# Patient Record
Sex: Male | Born: 1960 | Race: White | Hispanic: No | Marital: Single | Smoking: Current every day smoker
Health system: Southern US, Community
[De-identification: ages and names within clinical notes are randomized; demographics above are authoritative.]

## PROBLEM LIST (undated history)

## (undated) ENCOUNTER — Emergency Department (HOSPITAL_COMMUNITY): Payer: Self-pay

## (undated) HISTORY — PX: WISDOM TOOTH EXTRACTION: SHX21

---

## 2000-09-25 ENCOUNTER — Emergency Department (HOSPITAL_COMMUNITY): Admission: EM | Admit: 2000-09-25 | Discharge: 2000-09-25 | Payer: Self-pay

## 2000-09-25 ENCOUNTER — Encounter: Payer: Self-pay | Admitting: Emergency Medicine

## 2011-04-30 ENCOUNTER — Encounter (HOSPITAL_COMMUNITY): Payer: Self-pay | Admitting: Emergency Medicine

## 2011-04-30 ENCOUNTER — Emergency Department (HOSPITAL_COMMUNITY)
Admission: EM | Admit: 2011-04-30 | Discharge: 2011-05-01 | Disposition: A | Discharge status: Home / Self Care | Payer: No Typology Code available for payment source | Attending: Emergency Medicine | Admitting: Emergency Medicine

## 2011-04-30 DIAGNOSIS — IMO0001 Reserved for inherently not codable concepts without codable children: Secondary | ICD-10-CM | POA: Insufficient documentation

## 2011-04-30 DIAGNOSIS — M545 Low back pain, unspecified: Secondary | ICD-10-CM | POA: Insufficient documentation

## 2011-04-30 DIAGNOSIS — M542 Cervicalgia: Secondary | ICD-10-CM | POA: Insufficient documentation

## 2011-04-30 DIAGNOSIS — M549 Dorsalgia, unspecified: Secondary | ICD-10-CM | POA: Insufficient documentation

## 2011-04-30 DIAGNOSIS — M25559 Pain in unspecified hip: Secondary | ICD-10-CM | POA: Insufficient documentation
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
## 2011-05-01 ENCOUNTER — Emergency Department (HOSPITAL_COMMUNITY): Payer: No Typology Code available for payment source

## 2011-05-01 MED ORDER — HYDROCODONE-ACETAMINOPHEN 5-325 MG PO TABS: 1.0000 | ORAL_TABLET | Freq: Four times a day (QID) | ORAL | Status: AC | PRN

## 2011-05-01 MED ORDER — HYDROCODONE-ACETAMINOPHEN 5-325 MG PO TABS
1.0000 | ORAL_TABLET | Freq: Once | ORAL | Status: AC
  Administered 2011-05-01: 1 via ORAL
  Filled 2011-05-01: qty 1

## 2011-05-01 MED ORDER — IBUPROFEN 200 MG PO TABS
400.0000 mg | ORAL_TABLET | Freq: Once | ORAL | Status: AC
  Administered 2011-05-01: 400 mg via ORAL
  Filled 2011-05-01: qty 2

## 2011-05-01 MED ORDER — IBUPROFEN 600 MG PO TABS: 600.0000 mg | ORAL_TABLET | Freq: Four times a day (QID) | ORAL | Status: AC | PRN

## 2011-05-01 NOTE — ED Provider Notes (Signed)
Medical screening examination/treatment/procedure(s) were performed by non-physician practitioner and as supervising physician I was immediately available for consultation/collaboration.   Reyce Lubeck W Knox Cervi, MD 05/01/11 1605 

## 2012-03-05 ENCOUNTER — Emergency Department (HOSPITAL_COMMUNITY): Payer: Self-pay

## 2012-03-05 ENCOUNTER — Emergency Department (HOSPITAL_COMMUNITY)
Admission: EM | Admit: 2012-03-05 | Discharge: 2012-03-05 | Disposition: A | Discharge status: Home / Self Care | Payer: Self-pay | Attending: Emergency Medicine | Admitting: Emergency Medicine

## 2012-03-05 ENCOUNTER — Encounter (HOSPITAL_COMMUNITY): Payer: Self-pay | Admitting: Emergency Medicine

## 2012-03-05 DIAGNOSIS — R112 Nausea with vomiting, unspecified: Secondary | ICD-10-CM | POA: Insufficient documentation

## 2012-03-05 DIAGNOSIS — R059 Cough, unspecified: Secondary | ICD-10-CM | POA: Insufficient documentation

## 2012-03-05 DIAGNOSIS — R509 Fever, unspecified: Secondary | ICD-10-CM | POA: Insufficient documentation

## 2012-03-05 DIAGNOSIS — R5383 Other fatigue: Secondary | ICD-10-CM | POA: Insufficient documentation

## 2012-03-05 DIAGNOSIS — F17200 Nicotine dependence, unspecified, uncomplicated: Secondary | ICD-10-CM | POA: Insufficient documentation

## 2012-03-05 DIAGNOSIS — R5381 Other malaise: Secondary | ICD-10-CM | POA: Insufficient documentation

## 2012-03-05 DIAGNOSIS — R05 Cough: Secondary | ICD-10-CM | POA: Insufficient documentation

## 2012-03-05 DIAGNOSIS — J3489 Other specified disorders of nose and nasal sinuses: Secondary | ICD-10-CM | POA: Insufficient documentation

## 2012-03-05 DIAGNOSIS — IMO0001 Reserved for inherently not codable concepts without codable children: Secondary | ICD-10-CM | POA: Insufficient documentation

## 2012-03-05 DIAGNOSIS — R51 Headache: Secondary | ICD-10-CM | POA: Insufficient documentation

## 2012-03-05 DIAGNOSIS — R197 Diarrhea, unspecified: Secondary | ICD-10-CM | POA: Insufficient documentation

## 2012-03-05 DIAGNOSIS — R52 Pain, unspecified: Secondary | ICD-10-CM | POA: Insufficient documentation

## 2012-03-05 LAB — CBC WITH DIFFERENTIAL/PLATELET
Basophils Absolute: 0 10*3/uL (ref 0.0–0.1)
Eosinophils Absolute: 0 10*3/uL (ref 0.0–0.7)
Lymphs Abs: 1.5 10*3/uL (ref 0.7–4.0)
MCH: 31.7 pg (ref 26.0–34.0)
MCHC: 36.3 g/dL — ABNORMAL HIGH (ref 30.0–36.0)
MCV: 87.5 fL (ref 78.0–100.0)
Monocytes Absolute: 1.7 10*3/uL — ABNORMAL HIGH (ref 0.1–1.0)
Neutrophils Relative %: 81 % — ABNORMAL HIGH (ref 43–77)
Platelets: 336 10*3/uL (ref 150–400)
RBC: 4.95 MIL/uL (ref 4.22–5.81)
RDW: 12.9 % (ref 11.5–15.5)

## 2012-03-05 LAB — URINALYSIS, MICROSCOPIC ONLY
Glucose, UA: NEGATIVE mg/dL
Hgb urine dipstick: NEGATIVE
Leukocytes, UA: NEGATIVE
Protein, ur: 30 mg/dL — AB
Specific Gravity, Urine: 1.013 (ref 1.005–1.030)
Urobilinogen, UA: 0.2 mg/dL (ref 0.0–1.0)

## 2012-03-05 LAB — COMPREHENSIVE METABOLIC PANEL
ALT: 18 U/L (ref 0–53)
AST: 20 U/L (ref 0–37)
Calcium: 9.7 mg/dL (ref 8.4–10.5)
Creatinine, Ser: 0.75 mg/dL (ref 0.50–1.35)
GFR calc Af Amer: >90 mL/min (ref >90)
GFR calc non Af Amer: >90 mL/min (ref >90)
Sodium: 134 mEq/L — ABNORMAL LOW (ref 135–145)
Total Protein: 7.8 g/dL (ref 6.0–8.3)

## 2012-03-05 MED ORDER — SODIUM CHLORIDE 0.9 % IV BOLUS (SEPSIS): 1000.0000 mL | Freq: Once | INTRAVENOUS | Status: DC

## 2012-03-05 MED ORDER — ALBUTEROL SULFATE HFA 108 (90 BASE) MCG/ACT IN AERS
2.0000 | INHALATION_SPRAY | RESPIRATORY_TRACT | Status: DC | PRN
  Administered 2012-03-05: 2 via RESPIRATORY_TRACT
  Filled 2012-03-05: qty 6.7

## 2012-03-05 MED ORDER — ONDANSETRON HCL 4 MG/2ML IJ SOLN
4.0000 mg | Freq: Once | INTRAMUSCULAR | Status: AC
  Administered 2012-03-05: 4 mg via INTRAVENOUS
  Filled 2012-03-05: qty 2

## 2012-03-05 MED ORDER — ONDANSETRON 8 MG PO TBDP: 8.0000 mg | ORAL_TABLET | Freq: Three times a day (TID) | ORAL | Status: AC | PRN

## 2012-03-05 MED ORDER — ONDANSETRON 8 MG PO TBDP: 8.0000 mg | ORAL_TABLET | Freq: Three times a day (TID) | ORAL | Status: DC | PRN

## 2012-03-05 MED ORDER — ACETAMINOPHEN 325 MG PO TABS
650.0000 mg | ORAL_TABLET | Freq: Once | ORAL | Status: AC
  Administered 2012-03-05: 650 mg via ORAL
  Filled 2012-03-05: qty 2

## 2012-03-05 MED ORDER — AEROCHAMBER Z-STAT PLUS/MEDIUM MISC
1.0000 | Freq: Once | Status: AC
  Administered 2012-03-05: 1
  Filled 2012-03-05: qty 1

## 2012-03-05 MED ORDER — IBUPROFEN 600 MG PO TABS: 600.0000 mg | ORAL_TABLET | Freq: Four times a day (QID) | ORAL | Status: AC | PRN

## 2012-03-05 MED ORDER — IBUPROFEN 600 MG PO TABS: 600.0000 mg | ORAL_TABLET | Freq: Four times a day (QID) | ORAL | Status: DC | PRN

## 2012-03-05 MED ORDER — SODIUM CHLORIDE 0.9 % IV SOLN
1000.0000 mL | Freq: Once | INTRAVENOUS | Status: AC
  Administered 2012-03-05: 1000 mL via INTRAVENOUS

## 2012-03-05 MED ORDER — SODIUM CHLORIDE 0.9 % IV BOLUS (SEPSIS)
1000.0000 mL | Freq: Once | INTRAVENOUS | Status: AC
  Administered 2012-03-05: 1000 mL via INTRAVENOUS

## 2012-03-05 NOTE — ED Notes (Signed)
Pt presenting to ed with c/o nausea, vomiting, diarrhea and body aches x 5 day with chills

## 2012-03-05 NOTE — Progress Notes (Signed)
Pt listed with no insurance coverage CM and [facility] liaison spoke with pt.  Pt offered services to assist with finding a [place] self pay provider & health reform information 

## 2012-03-05 NOTE — ED Provider Notes (Signed)
History     CSN: [id number]  Arrival date & time 03/05/12  1131   First MD Initiated Contact with Patient 03/05/12 1226      Chief Complaint  Patient presents with  . Nausea  . Emesis  . Headache  . Generalized Body Aches    (Consider location/radiation/quality/duration/timing/severity/associated sxs/prior treatment) HPI Comments: Patient presents with four-day history of subjective fever, chills, myalgias, frequent nonbloody, nonbilious vomiting, nonbloody diarrhea, cough. States the symptoms are gradually getting worse. He has been able to keep down small amounts of water. No treatments prior to arrival. No chest pain or abdominal pain. No history of COPD although the patient is a long-time smoker. No sick contacts. Did not get a flu shot this year. Onset acute. Nothing makes symptoms better or worse.  Patient is a 52 y.o. male presenting with vomiting and headaches. The history is provided by the patient.  Emesis  Associated symptoms include chills, cough, diarrhea, a fever, headaches and myalgias. Pertinent negatives include no abdominal pain.  Headache  Associated symptoms include a fever, nausea and vomiting. Pertinent negatives include no shortness of breath.    History reviewed. No pertinent past medical history.  History reviewed. No pertinent past surgical history.  No family history on file.  History  Substance Use Topics  . Smoking status: Current Every Day Smoker -- 1.0 packs/day    Types: Cigarettes  . Smokeless tobacco: Not on file  . Alcohol Use: No      Review of Systems  Constitutional: Positive for fever, chills and fatigue.  HENT: Positive for congestion and sore throat. Negative for ear pain, rhinorrhea, neck stiffness and sinus pressure.   Eyes: Negative for redness.  Respiratory: Positive for cough. Negative for shortness of breath and wheezing.   Gastrointestinal: Positive for nausea, vomiting and diarrhea. Negative for abdominal pain.    Genitourinary: Negative for dysuria.  Musculoskeletal: Positive for myalgias.  Skin: Negative for rash.  Neurological: Positive for headaches.  Hematological: Negative for adenopathy.    Allergies  Review of patient's allergies indicates no known allergies.  Home Medications   Current Outpatient Rx  Name  Route  Sig  Dispense  Refill  . DAYQUIL MULTI-SYMPTOM COLD/FLU PO   Oral   Take 2 tablets by mouth every 6 (six) hours as needed. For flu symptoms         . IBUPROFEN 600 MG PO TABS   Oral   Take 1 tablet (600 mg total) by mouth every 6 (six) hours as needed for pain.   20 tablet   0   . ONDANSETRON 8 MG PO TBDP   Oral   Take 1 tablet (8 mg total) by mouth every 8 (eight) hours as needed for nausea.   6 tablet   0     BP 153/99  Pulse 100  Temp 99.2 F (37.3 C) (Oral)  Resp 24  SpO2 100%  Physical Exam  Nursing note and vitals reviewed. Constitutional: He appears well-developed and well-nourished.  HENT:  Head: Normocephalic and atraumatic. No trismus in the jaw.  Right Ear: Tympanic membrane, external ear and ear canal normal.  Left Ear: Tympanic membrane, external ear and ear canal normal.  Nose: Nose normal. No mucosal edema or rhinorrhea.  Mouth/Throat: Uvula is midline. Mucous membranes are dry. No uvula swelling. Posterior oropharyngeal erythema present. No oropharyngeal exudate, posterior oropharyngeal edema or tonsillar abscesses.  Eyes: Conjunctivae normal are normal. Right eye exhibits no discharge. Left eye exhibits no discharge.  Neck: Normal  range of motion. Neck supple.  Cardiovascular: Normal rate, regular rhythm and normal heart sounds.   No murmur heard. Pulmonary/Chest: Effort normal and breath sounds normal. No respiratory distress. He has no wheezes. He has no rales.  Abdominal: Soft. There is no tenderness.  Neurological: He is alert.  Skin: Skin is warm and dry.  Psychiatric: He has a normal mood and affect.    ED Course   Procedures (including critical care time)  Labs Reviewed  CBC WITH DIFFERENTIAL - Abnormal; Notable for the following:    WBC 17.2 (*)     MCHC 36.3 (*)     Neutrophils Relative 81 (*)     Lymphocytes Relative 9 (*)     Neutro Abs 14.0 (*)     Monocytes Absolute 1.7 (*)     All other components within normal limits  COMPREHENSIVE METABOLIC PANEL - Abnormal; Notable for the following:    Sodium 134 (*)     Potassium 3.2 (*)     Chloride 92 (*)     Glucose, Bld 175 (*)     All other components within normal limits  URINALYSIS, MICROSCOPIC ONLY - Abnormal; Notable for the following:    pH 8.5 (*)     Ketones, ur TRACE (*)     Protein, ur 30 (*)     All other components within normal limits  LIPASE, BLOOD  LAB REPORT - SCANNED   Dg Chest 2 View  03/05/2012  *RADIOLOGY REPORT*  Clinical Data: Nausea, emesis, headache, body aches  CHEST - 2 VIEW  Comparison: Prior radiographs of the thoracic spine including the chest 05/01/2011  Findings: Mild pulmonary hyperinflation.  Otherwise, negative for focal airspace consolidation, or pulmonary edema.  There may be minimal peribronchial cuffing.  Cardiac and mediastinal contours within normal limits.  No acute osseous abnormality.  IMPRESSION:  Mild pulmonary hyperinflation and bronchitic changes without focal infiltrate.   Original Report Authenticated By: Malachy Moan, M.D.      1. Influenza-like illness     12:42 PM Patient seen and examined. Work-up initiated. Medications ordered.   Vital signs reviewed and are as follows: Filed Vitals:   03/05/12 1150  BP: 153/99  Pulse: 100  Temp: 99.2 F (37.3 C)  Resp: 24   Patient in ED for several hours. Was not able to urinate at first, given several liters of fluid. No further vomiting with antiemetics and has tolerated PO's.   D/c home with albuterol inhaler. Patient counseled on use of albuterol HFA.  Told to use 1-2 puffs q 4 hours as needed for SOB.  Patient discharged to home.  Encouraged to rest and drink plenty of fluids.  Patient told to return to ED or see their primary doctor if their symptoms worsen, high fever not controlled with tylenol, persistent vomiting, they feel they are dehydrated, or if they have any other concerns.  Patient verbalized understanding and agreed with plan.    BP 124/73  Pulse 88  Temp 99.3 F (37.4 C) (Oral)  Resp 18  SpO2 94%   MDM  ILI: symptoms controlled in ED. Coughing, no PNA on CXR. Sx are consistent with ILI and his leukocytosis is likely a response to this. Patient appears like he is not feeling well, but is not toxic in appearance. No resp distress. Probable mild/moderate dehydration -- fluids given, tolerating PO's at d/c. No medical emergency suspected. He will need to rest for several days.         Renne Crigler,  PA 03/06/12 0820

## 2012-03-05 NOTE — ED Notes (Signed)
PA at bedside.

## 2012-03-05 NOTE — ED Notes (Signed)
OZH:[id number]<HQ> Expected date:03/05/12<BR> Expected time:11:23 AM<BR> Means of arrival:Ambulance<BR> Comments:<BR> 55yoM/weakness/hypertensive

## 2012-03-05 NOTE — ED Notes (Signed)
Made patient aware that we need a urine sample and he stated that he is still unable to use it.

## 2012-03-05 NOTE — ED Notes (Signed)
Instructions given on use of inhaler and aerochamber; patient able to perform return demonstration. Patient given phone to call for ride.

## 2012-03-06 NOTE — ED Provider Notes (Signed)
Medical screening examination/treatment/procedure(s) were performed by non-physician practitioner and as supervising physician I was immediately available for consultation/collaboration.  Tobin Chad, MD 03/06/12 [phone]

## 2013-05-28 ENCOUNTER — Emergency Department (HOSPITAL_COMMUNITY)
Admission: EM | Admit: 2013-05-28 | Discharge: 2013-05-28 | Disposition: A | Discharge status: Home / Self Care | Payer: Self-pay | Attending: Emergency Medicine | Admitting: Emergency Medicine

## 2013-05-28 ENCOUNTER — Encounter (HOSPITAL_COMMUNITY): Payer: Self-pay | Admitting: Emergency Medicine

## 2013-05-28 ENCOUNTER — Emergency Department (HOSPITAL_COMMUNITY): Payer: Self-pay

## 2013-05-28 DIAGNOSIS — S9031XA Contusion of right foot, initial encounter: Secondary | ICD-10-CM

## 2013-05-28 DIAGNOSIS — Y9302 Activity, running: Secondary | ICD-10-CM | POA: Insufficient documentation

## 2013-05-28 DIAGNOSIS — S9030XA Contusion of unspecified foot, initial encounter: Secondary | ICD-10-CM | POA: Insufficient documentation

## 2013-05-28 DIAGNOSIS — IMO0002 Reserved for concepts with insufficient information to code with codable children: Secondary | ICD-10-CM | POA: Insufficient documentation

## 2013-05-28 DIAGNOSIS — Y929 Unspecified place or not applicable: Secondary | ICD-10-CM | POA: Insufficient documentation

## 2013-05-28 DIAGNOSIS — F17200 Nicotine dependence, unspecified, uncomplicated: Secondary | ICD-10-CM | POA: Insufficient documentation

## 2013-05-28 MED ORDER — TRAMADOL HCL 50 MG PO TABS: 50.0000 mg | ORAL_TABLET | Freq: Four times a day (QID) | ORAL | Status: AC | PRN

## 2013-05-28 NOTE — Progress Notes (Signed)
[facility] provided pt with a list of primary care resources and a [facility] [employer] Card application to help patient establish primary care.  °

## 2013-05-28 NOTE — ED Notes (Signed)
Pt from home reports that he was chasing a soccer ball, lost his shoe and stepped on a rock approx 1 week ago. Pt reports that he thought it was just bruised, but is still unable to walk or bear full weight. Pt can wiggle toes and has good cap refill. Pt denies other injuries. Pt is A&O and in NAD

## 2013-05-28 NOTE — ED Provider Notes (Signed)
CSN: [id number]     Arrival date & time 05/28/13  1214 History  This chart was scribed for non-physician practitioner Junius FinnerErin O'Malley, PA-C working with Raeford RazorStephen Kohut, MD by Dorothey Basemania Sutton, ED Scribe. This patient was seen in room WTR9/WTR9 and the patient's care was started at 1:03 PM.    Chief Complaint  Patient presents with  . Foot Injury   The history is provided by the patient. No language interpreter was used.   HPI Comments: James Wilson is a 53 y.o. male who presents to the Emergency Department complaining of an injury to the right foot that he sustained about a week ago when he reports that he stepped on a rock while running. Patient is complaining of a constant, gradual onset pain, 7/10 currently, to the bottom of the foot that he states is exacerbated to a 10/10 with walking/bearing weight. Patient reports intermittently taking aspirin at home without significant relief. He denies history of prior injury to the area. He denies any allergies to medications. Patient has no other pertinent medical history.   History reviewed. No pertinent past medical history. Past Surgical History  Procedure Laterality Date  . Wisdom tooth extraction     No family history on file. History  Substance Use Topics  . Smoking status: Current Every Day Smoker -- 1.00 packs/day    Types: Cigarettes  . Smokeless tobacco: Not on file  . Alcohol Use: Yes     Comment: social    Review of Systems  Musculoskeletal: Positive for arthralgias and myalgias.  All other systems reviewed and are negative.   Allergies  Review of patient's allergies indicates no known allergies.  Home Medications   Current Outpatient Rx  Name  Route  Sig  Dispense  Refill  . ibuprofen (ADVIL,MOTRIN) 600 MG tablet   Oral   Take 1 tablet (600 mg total) by mouth every 6 (six) hours as needed for pain.   20 tablet   0   . ondansetron (ZOFRAN ODT) 8 MG disintegrating tablet   Oral   Take 1 tablet (8 mg total) by mouth every  8 (eight) hours as needed for nausea.   6 tablet   0   . Pseudoephedrine-APAP-DM (DAYQUIL MULTI-SYMPTOM COLD/FLU PO)   Oral   Take 2 tablets by mouth every 6 (six) hours as needed. For flu symptoms         . traMADol (ULTRAM) 50 MG tablet   Oral   Take 1 tablet (50 mg total) by mouth every 6 (six) hours as needed.   15 tablet   0    Triage Vitals: BP 171/95  Pulse 77  Temp(Src) 98.3 F (36.8 C) (Oral)  Resp 16  SpO2 97%  Physical Exam  Nursing note and vitals reviewed. Constitutional: He is oriented to person, place, and time. He appears well-developed and well-nourished.  HENT:  Head: Normocephalic and atraumatic.  Eyes: EOM are normal.  Neck: Normal range of motion.  Cardiovascular: Normal rate.   Pulmonary/Chest: Effort normal.  Musculoskeletal: Normal range of motion.  Ecchymosis on the plantar aspect of the right foot along the medial aspect. Tenderness to palpation. Full range of motion of the ankle. DP pulse 2+.   Neurological: He is alert and oriented to person, place, and time.  Skin: Skin is warm and dry.  Ecchymosis of right foot. Skin intact. No erythema or warmth.   Psychiatric: He has a normal mood and affect. His behavior is normal.    ED Course  Procedures (including critical care time)  DIAGNOSTIC STUDIES: Oxygen Saturation is 97% on room air, normal by my interpretation.    COORDINATION OF CARE: 1:05 PM- Ordered an x-ray of the right foot and discussed that results were negative. Will consult with Dr. Juleen China. Will tx as contusion and provider crutches and ASO ankle brace. Discussed treatment plan with patient at bedside and patient verbalized agreement.   Imaging Review Dg Foot Complete Right  05/28/2013   CLINICAL DATA:  Foot injury  EXAM: RIGHT FOOT COMPLETE - 3+ VIEW  COMPARISON:  None.  FINDINGS: There is no evidence of fracture or dislocation. There is no evidence of arthropathy or other focal bone abnormality. Soft tissues are unremarkable.   IMPRESSION: Negative.   Electronically Signed   By: Marlan Palau M.D.   On: 05/28/2013 12:47     EKG Interpretation None      MDM   Final diagnoses:  Contusion of foot, right   I personally performed the services described in this documentation, which was scribed in my presence. The recorded information has been reviewed and is accurate.     Junius Finner, PA-C 05/29/13 1201

## 2013-05-28 NOTE — Discharge Instructions (Signed)
Foot Contusion   A foot contusion is a deep bruise to the foot. Contusions happen when an injury causes bleeding under the skin. Signs of bruising include pain, puffiness (swelling), and discolored skin. The contusion may turn blue, purple, or yellow.  HOME CARE  · Put ice on the injured area.  · Put ice in a plastic bag.  · Place a towel between your skin and the bag.  · Leave the ice on for 15-20 minutes, 03-04 times a day.  · Only take medicines as told by your doctor.  · Use an elastic wrap only as told. You may remove the wrap for sleeping, showering, and bathing. Take the wrap off if you lose feeling (numb) in your toes, or they turn blue or cold. Put the wrap on more loosely.  · Keep the foot raised (elevated) with pillows.  · If your foot hurts, avoid standing or walking.  · When your doctor says it is okay to use your foot, start using it slowly. If you have pain, lessen how much you use your foot.  · See your doctor as told.  GET HELP RIGHT AWAY IF:   · You have more redness, puffiness, or pain in your foot.  · Your puffiness or pain does not get better with medicine.  · You lose feeling in your foot, or you cannot move your toes.  · Your foot turns cold or blue.  · You have pain when you move your toes.  · Your foot feels warm.  · Your contusion does not get better in 2 days.  MAKE SURE YOU:   · Understand these instructions.  · Will watch this condition.  · Will get help right away if you or your child is not doing well or gets worse.  Document Released: 11/28/2007 Document Revised: 08/20/2011 Document Reviewed: 01/22/2011  [employer]® Patient Information ©2014 [employer].

## 2013-05-30 NOTE — ED Provider Notes (Signed)
Medical screening examination/treatment/procedure(s) were performed by non-physician practitioner and as supervising physician I was immediately available for consultation/collaboration.   EKG Interpretation None       Parilee Hally, MD 05/30/13 0659 

## 2015-04-05 ENCOUNTER — Emergency Department (HOSPITAL_BASED_OUTPATIENT_CLINIC_OR_DEPARTMENT_OTHER): Payer: Self-pay

## 2015-04-05 ENCOUNTER — Emergency Department (HOSPITAL_BASED_OUTPATIENT_CLINIC_OR_DEPARTMENT_OTHER)
Admission: EM | Admit: 2015-04-05 | Discharge: 2015-04-05 | Disposition: A | Discharge status: Home / Self Care | Payer: No Typology Code available for payment source | Attending: Emergency Medicine | Admitting: Emergency Medicine

## 2015-04-05 ENCOUNTER — Encounter (HOSPITAL_BASED_OUTPATIENT_CLINIC_OR_DEPARTMENT_OTHER): Payer: Self-pay

## 2015-04-05 DIAGNOSIS — J069 Acute upper respiratory infection, unspecified: Secondary | ICD-10-CM | POA: Insufficient documentation

## 2015-04-05 DIAGNOSIS — R059 Cough, unspecified: Secondary | ICD-10-CM

## 2015-04-05 DIAGNOSIS — R05 Cough: Secondary | ICD-10-CM

## 2015-04-05 DIAGNOSIS — F17210 Nicotine dependence, cigarettes, uncomplicated: Secondary | ICD-10-CM | POA: Insufficient documentation

## 2015-04-05 MED ORDER — FLUTICASONE PROPIONATE 50 MCG/ACT NA SUSP: 2.0000 | Freq: Every day | NASAL | Status: AC

## 2015-04-05 NOTE — Discharge Instructions (Signed)
Please read attached information. If you experience any new or worsening signs or symptoms please return to the emergency room for evaluation. Please follow-up with your primary care provider or specialist as discussed. Please use medication prescribed only as directed and discontinue taking if you have any concerning signs or symptoms.   °

## 2015-04-05 NOTE — ED Provider Notes (Signed)
CSN: [id number]     Arrival date & time 04/05/15  1023 History   First MD Initiated Contact with Patient 04/05/15 1043     Chief Complaint  Patient presents with  . Cough    HPI   55, presents today with cough. Patient reports symptoms started 6 days ago with a dry nonproductive cough, nasal congestion. He reports that symptoms lasted for approximately 3 days, then started to improve. He denies any chest pain, shortness breath, nausea, vomiting. Patient denies any fevers, pain in the ears, sore throat, neck stiffness, headache, or any other concerning signs or symptoms. Patient reports he staying at day [facility], the instructed him to be evaluated here in the emergency room. She has not tried any over-the-counter medications prior to arrival.    History reviewed. No pertinent past medical history. Past Surgical History  Procedure Laterality Date  . Wisdom tooth extraction     History reviewed. No pertinent family history. Social History  Substance Use Topics  . Smoking status: Current Every Day Smoker -- 1.00 packs/day    Types: Cigarettes  . Smokeless tobacco: None  . Alcohol Use: Yes     Comment: social    Review of Systems  All other systems reviewed and are negative.   Allergies  Review of patient's allergies indicates no known allergies.  Home Medications   Prior to Admission medications   Medication Sig Start Date End Date Taking? Authorizing Provider  TRAZODONE HCL PO Take by mouth.   Yes Historical Provider, MD  fluticasone (FLONASE) 50 MCG/ACT nasal spray Place 2 sprays into both nostrils daily. 04/05/15   Eyvonne Mechanic, PA-C  ibuprofen (ADVIL,MOTRIN) 600 MG tablet Take 1 tablet (600 mg total) by mouth every 6 (six) hours as needed for pain. 03/05/12   Renne Crigler, PA-C  ondansetron (ZOFRAN ODT) 8 MG disintegrating tablet Take 1 tablet (8 mg total) by mouth every 8 (eight) hours as needed for nausea. 03/05/12   Renne Crigler, PA-C  Pseudoephedrine-APAP-DM (DAYQUIL  MULTI-SYMPTOM COLD/FLU PO) Take 2 tablets by mouth every 6 (six) hours as needed. For flu symptoms    Historical Provider, MD  traMADol (ULTRAM) 50 MG tablet Take 1 tablet (50 mg total) by mouth every 6 (six) hours as needed. 05/28/13   Junius Finner, PA-C   BP 154/90 mmHg  Pulse 87  Temp(Src) 98.3 F (36.8 C) (Oral)  Resp 18  Ht  (1.727 m)  Wt 68.04 kg  BMI 22.81 kg/m2  SpO2 99%   Physical Exam  Constitutional: He is oriented to person, place, and time. He appears well-developed and well-nourished.  HENT:  Head: Normocephalic and atraumatic.  Right Ear: Hearing and tympanic membrane normal.  Left Ear: Hearing and tympanic membrane normal.  Mouth/Throat: Uvula is midline and oropharynx is clear and moist. No oropharyngeal exudate, posterior oropharyngeal edema, posterior oropharyngeal erythema or tonsillar abscesses.  Eyes: Conjunctivae are normal. Pupils are equal, round, and reactive to light. Right eye exhibits no discharge. Left eye exhibits no discharge. No scleral icterus.  Neck: Normal range of motion. No JVD present. No tracheal deviation present.  Cardiovascular: Normal rate, regular rhythm, normal heart sounds and intact distal pulses.  Exam reveals no gallop and no friction rub.   No murmur heard. Pulmonary/Chest: Effort normal and breath sounds normal. No stridor. No respiratory distress. He has no wheezes. He has no rales. He exhibits no tenderness.  Neurological: He is alert and oriented to person, place, and time. Coordination normal.  Psychiatric: He has  a normal mood and affect. His behavior is normal. Judgment and thought content normal.  Nursing note and vitals reviewed.   ED Course  Procedures (including critical care time) Labs Review Labs Reviewed - No data to display  Imaging Review Dg Chest 2 View  04/05/2015  CLINICAL DATA:  Cough, congestion for 5 days, smoker EXAM: CHEST  2 VIEW COMPARISON:  03/05/2012 FINDINGS: Cardiomediastinal silhouette is  stable. No acute infiltrate or pulmonary edema. Again noted hyperinflation and chronic mild bronchitic changes. Mild degenerative changes thoracic spine. Stable compression deformities lower thoracic spine. IMPRESSION: No infiltrate or pulmonary edema. Stable hyperinflation and chronic mild bronchitic changes. Stable mild compression deformities lower thoracic spine. Electronically Signed   By: Natasha Mead M.D.   On: 04/05/2015 11:13   I have personally reviewed and evaluated these images and lab results as part of my medical decision-making.   EKG Interpretation None      MDM   Final diagnoses:  Cough  URI (upper respiratory infection)    Labs:  Imaging:  Consults:  Therapeutics: Flonase  Discharge Meds:   Assessment/Plan: 55 year old male presents today for evaluation of URI-type symptoms. Patient's presentation is most consistent with viral URI. He'll be discharged home with Flonase, symptomatic care instructions, primary care follow-up. Patient is afebrile nontoxic in no acute distress. His chest x-ray shows no signs of infectious etiology, oxygen 99% here. Patient verbalizes understanding and agreement for today's plan and no further questions or concerns at time of discharge         Eyvonne Mechanic, PA-C 04/05/15 1650  Loren Racer, MD 04/06/15 [phone]

## 2015-04-05 NOTE — ED Notes (Signed)
Pt reports productive cough with yellow sputum production since Friday. States he also has nasal congestion.

## 2017-05-26 ENCOUNTER — Emergency Department (HOSPITAL_COMMUNITY)
Admission: EM | Admit: 2017-05-26 | Discharge: 2017-05-26 | Disposition: A | Discharge status: Home / Self Care | Payer: Self-pay | Attending: Emergency Medicine | Admitting: Emergency Medicine

## 2017-05-26 ENCOUNTER — Other Ambulatory Visit: Payer: Self-pay

## 2017-05-26 ENCOUNTER — Encounter (HOSPITAL_COMMUNITY): Payer: Self-pay

## 2017-05-26 ENCOUNTER — Emergency Department (HOSPITAL_COMMUNITY): Payer: Self-pay

## 2017-05-26 DIAGNOSIS — Y9389 Activity, other specified: Secondary | ICD-10-CM | POA: Insufficient documentation

## 2017-05-26 DIAGNOSIS — W228XXA Striking against or struck by other objects, initial encounter: Secondary | ICD-10-CM | POA: Insufficient documentation

## 2017-05-26 DIAGNOSIS — F17210 Nicotine dependence, cigarettes, uncomplicated: Secondary | ICD-10-CM | POA: Insufficient documentation

## 2017-05-26 DIAGNOSIS — Y999 Unspecified external cause status: Secondary | ICD-10-CM | POA: Insufficient documentation

## 2017-05-26 DIAGNOSIS — Y929 Unspecified place or not applicable: Secondary | ICD-10-CM | POA: Insufficient documentation

## 2017-05-26 DIAGNOSIS — S62346A Nondisplaced fracture of base of fifth metacarpal bone, right hand, initial encounter for closed fracture: Secondary | ICD-10-CM | POA: Insufficient documentation

## 2017-05-26 MED ORDER — ACETAMINOPHEN 325 MG PO TABS
650.0000 mg | ORAL_TABLET | Freq: Once | ORAL | Status: AC
Start: 2017-05-26 — End: 2017-05-26
  Administered 2017-05-26: 650 mg via ORAL
  Filled 2017-05-26: qty 2
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
## 2017-05-26 NOTE — Discharge Instructions (Signed)
Please read instructions below. Keep your splint on at all times.  Keep it clean and dry. You can take Tylenol or ibuprofen/Advil every 6 hours as needed for pain. Schedule an appointment with the orthopedic hand specialist in 1 week for repeat x-ray and follow-up on your injury. Return to the ER for new or concerning symptoms.

## 2017-05-26 NOTE — ED Notes (Signed)
Ortho tech notified of need for immobilization devices.
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
## 2018-05-26 IMAGING — CR DG HAND COMPLETE 3+V*R*
3 series · 3 of 3 positions shown · non-contrast
Comparison: None in PACs

CLINICAL DATA: Right fifth metacarpal region pain after punching
his truck.

EXAM:
RIGHT HAND - COMPLETE 3+ VIEW

[x hand pa right]
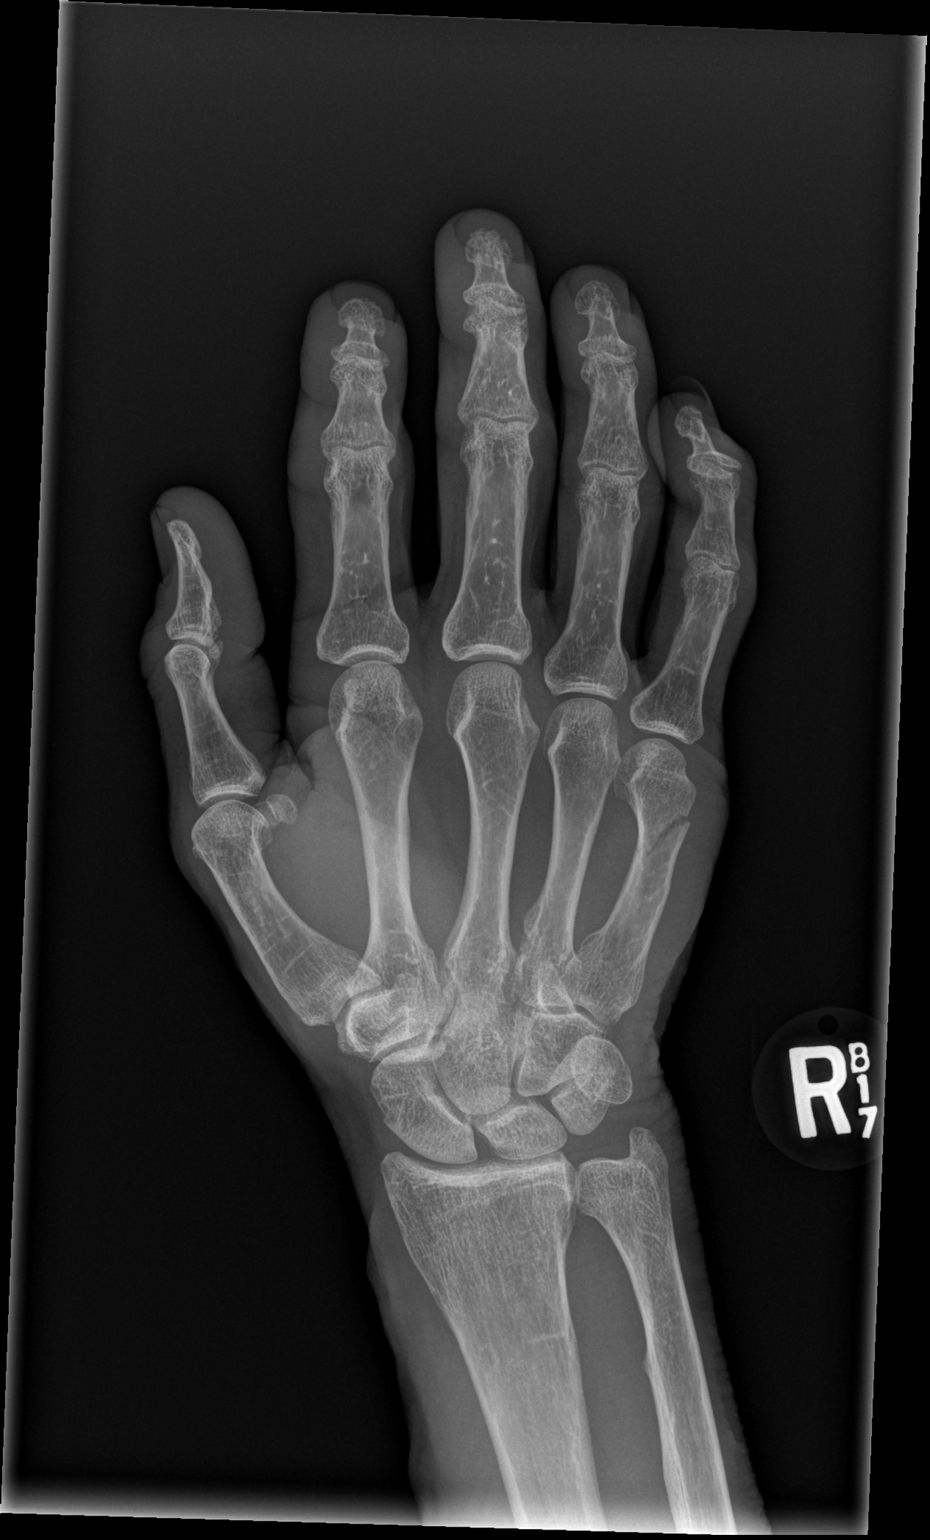

[x hand obl right]
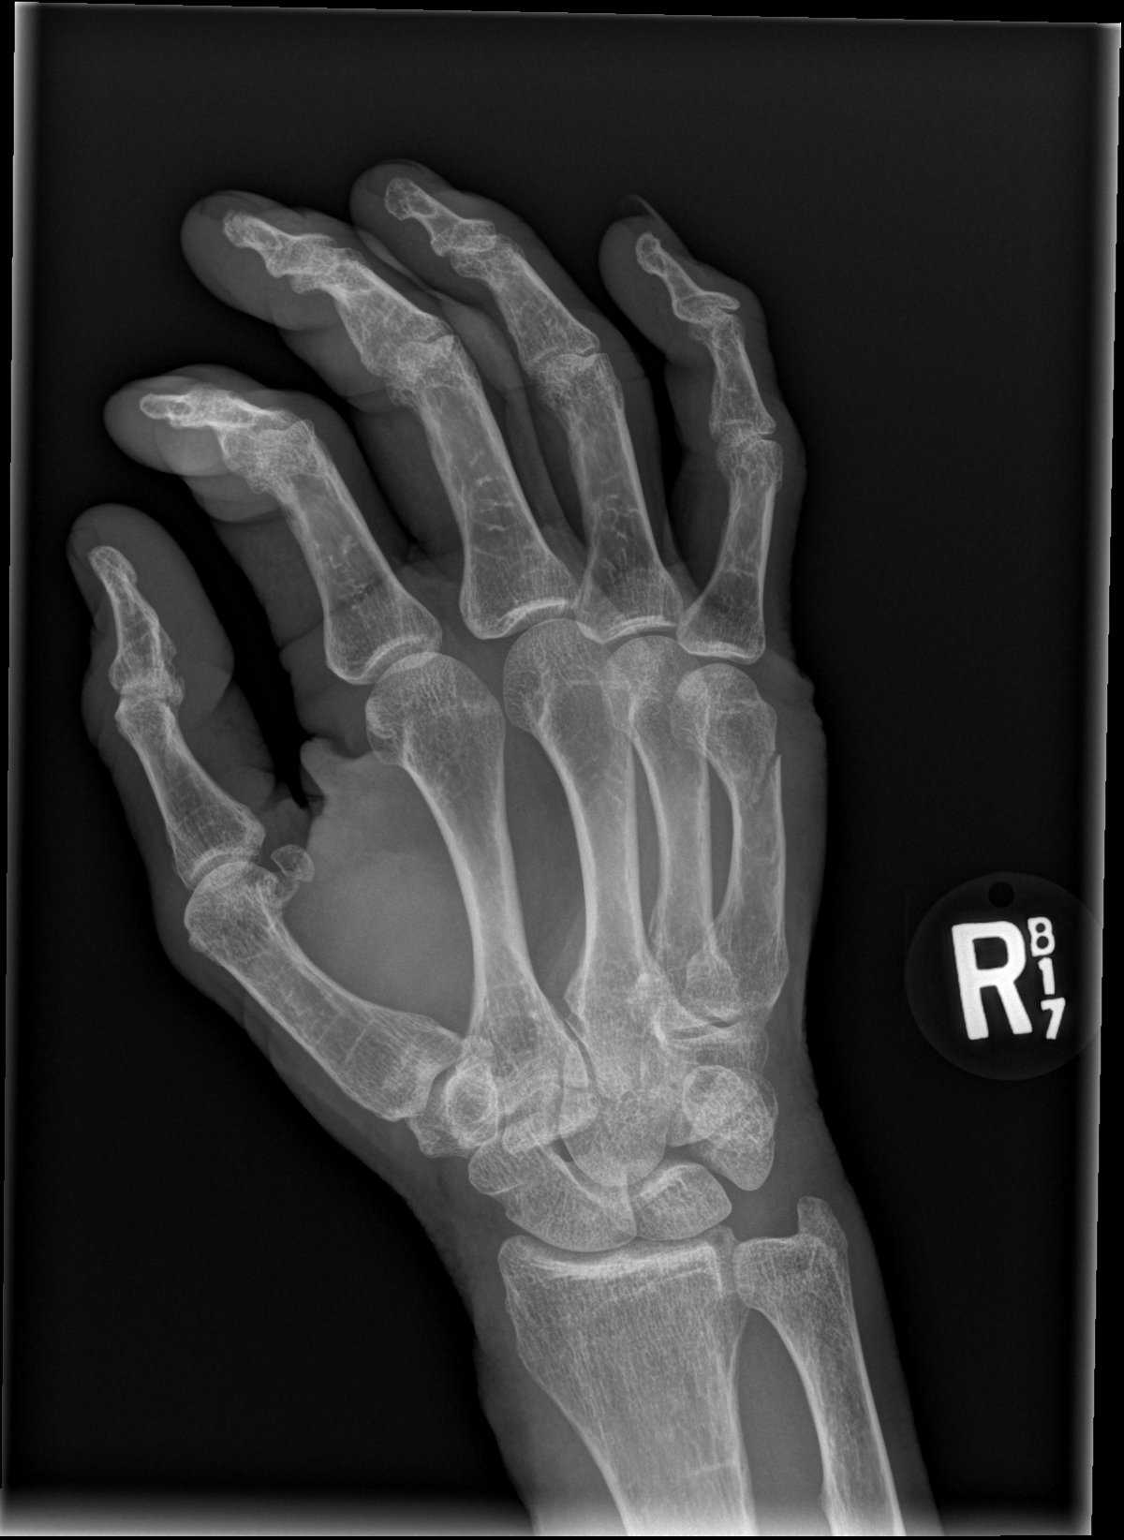

[x hand lat right]
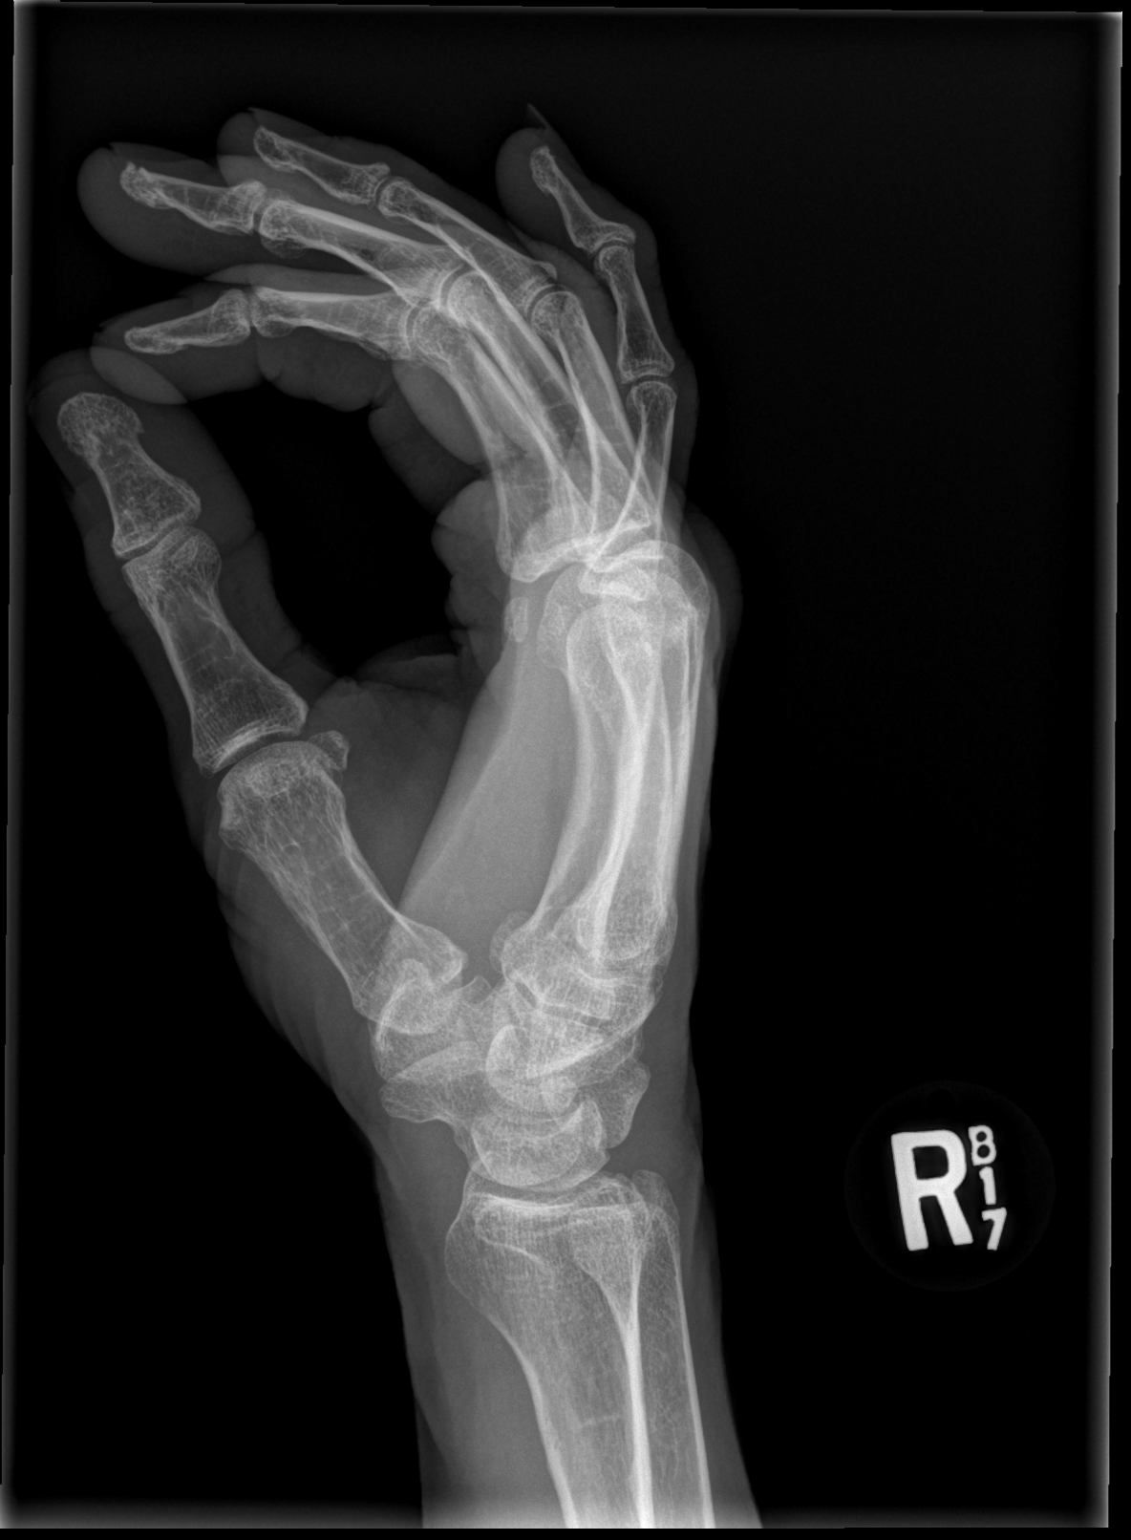

[3 of 3 positions shown; findings below may reference images not displayed]

FINDINGS: The bones are subjectively osteopenic. There is mildly angulated
fracture of the distal third of the shaft of the fifth metacarpal.
The other metacarpals are intact as are the phalanges. The joint
spaces are reasonably well maintained.
IMPRESSION: Mildly angulated fracture of the distal third of the shaft of the
fifth metacarpal.
